# Patient Record
Sex: Female | Born: 1965 | Race: White | Hispanic: No | Marital: Married | Smoking: Former smoker
Health system: Southern US, Community
[De-identification: ages and names within clinical notes are randomized; demographics above are authoritative.]

## PROBLEM LIST (undated history)

## (undated) DIAGNOSIS — J189 Pneumonia, unspecified organism: Secondary | ICD-10-CM

## (undated) DIAGNOSIS — R531 Weakness: Secondary | ICD-10-CM

## (undated) DIAGNOSIS — K5900 Constipation, unspecified: Secondary | ICD-10-CM

## (undated) DIAGNOSIS — E119 Type 2 diabetes mellitus without complications: Secondary | ICD-10-CM

## (undated) DIAGNOSIS — R350 Frequency of micturition: Secondary | ICD-10-CM

## (undated) DIAGNOSIS — R51 Headache: Secondary | ICD-10-CM

## (undated) DIAGNOSIS — R3912 Poor urinary stream: Secondary | ICD-10-CM

## (undated) DIAGNOSIS — B009 Herpesviral infection, unspecified: Secondary | ICD-10-CM

## (undated) DIAGNOSIS — F329 Major depressive disorder, single episode, unspecified: Secondary | ICD-10-CM

## (undated) DIAGNOSIS — M62838 Other muscle spasm: Secondary | ICD-10-CM

## (undated) DIAGNOSIS — F32A Depression, unspecified: Secondary | ICD-10-CM

## (undated) DIAGNOSIS — G4700 Insomnia, unspecified: Secondary | ICD-10-CM

## (undated) HISTORY — PX: OTHER SURGICAL HISTORY: SHX169

## (undated) HISTORY — PX: MOUTH SURGERY: SHX715

---

## 2013-08-15 ENCOUNTER — Other Ambulatory Visit: Payer: Self-pay | Admitting: Orthopedic Surgery

## 2013-08-16 ENCOUNTER — Encounter (HOSPITAL_COMMUNITY): Payer: Self-pay | Admitting: Pharmacist

## 2013-08-22 ENCOUNTER — Encounter (HOSPITAL_COMMUNITY)
Admission: RE | Admit: 2013-08-22 | Discharge: 2013-08-22 | Disposition: A | Discharge status: Home / Self Care | Payer: BC Managed Care – PPO | Source: Ambulatory Visit | Attending: Orthopedic Surgery | Admitting: Orthopedic Surgery

## 2013-08-22 ENCOUNTER — Encounter (HOSPITAL_COMMUNITY): Payer: Self-pay

## 2013-08-22 HISTORY — DX: Depression, unspecified: F32.A

## 2013-08-22 HISTORY — DX: Insomnia, unspecified: G47.00

## 2013-08-22 HISTORY — DX: Constipation, unspecified: K59.00

## 2013-08-22 HISTORY — DX: Herpesviral infection, unspecified: B00.9

## 2013-08-22 HISTORY — DX: Frequency of micturition: R35.0

## 2013-08-22 HISTORY — DX: Headache: R51

## 2013-08-22 HISTORY — DX: Poor urinary stream: R39.12

## 2013-08-22 HISTORY — DX: Other muscle spasm: M62.838

## 2013-08-22 HISTORY — DX: Type 2 diabetes mellitus without complications: E11.9

## 2013-08-22 HISTORY — DX: Major depressive disorder, single episode, unspecified: F32.9

## 2013-08-22 HISTORY — DX: Weakness: R53.1

## 2013-08-22 HISTORY — DX: Pneumonia, unspecified organism: J18.9

## 2013-08-22 LAB — CBC WITH DIFFERENTIAL/PLATELET
BASOS ABS: 0.1 10*3/uL (ref 0.0–0.1)
Basophils Relative: 1 % (ref 0–1)
EOS ABS: 0.4 10*3/uL (ref 0.0–0.7)
Eosinophils Relative: 3 % (ref 0–5)
HEMATOCRIT: 39.6 % (ref 36.0–46.0)
Hemoglobin: 14.1 g/dL (ref 12.0–15.0)
LYMPHS ABS: 3.7 10*3/uL (ref 0.7–4.0)
LYMPHS PCT: 30 % (ref 12–46)
MCH: 29.4 pg (ref 26.0–34.0)
MCHC: 35.6 g/dL (ref 30.0–36.0)
MCV: 82.7 fL (ref 78.0–100.0)
Monocytes Absolute: 0.9 10*3/uL (ref 0.1–1.0)
Monocytes Relative: 7 % (ref 3–12)
Neutro Abs: 7.2 10*3/uL (ref 1.7–7.7)
Neutrophils Relative %: 59 % (ref 43–77)
PLATELETS: 289 10*3/uL (ref 150–400)
RBC: 4.79 MIL/uL (ref 3.87–5.11)
RDW: 12.7 % (ref 11.5–15.5)
WBC: 12.3 10*3/uL — ABNORMAL HIGH (ref 4.0–10.5)

## 2013-08-22 LAB — URINALYSIS, ROUTINE W REFLEX MICROSCOPIC
BILIRUBIN URINE: NEGATIVE
Glucose, UA: 500 mg/dL — AB
Hgb urine dipstick: NEGATIVE
KETONES UR: NEGATIVE mg/dL
Leukocytes, UA: NEGATIVE
Nitrite: NEGATIVE
Protein, ur: NEGATIVE mg/dL
Specific Gravity, Urine: 1.017 (ref 1.005–1.030)
UROBILINOGEN UA: 0.2 mg/dL (ref 0.0–1.0)
pH: 6 (ref 5.0–8.0)

## 2013-08-22 LAB — COMPREHENSIVE METABOLIC PANEL
ALBUMIN: 3.6 g/dL (ref 3.5–5.2)
ALT: 25 U/L (ref 0–35)
AST: <5 U/L (ref 0–37)
Alkaline Phosphatase: 55 U/L (ref 39–117)
BUN: 8 mg/dL (ref 6–23)
CO2: 29 mEq/L (ref 19–32)
CREATININE: 0.71 mg/dL (ref 0.50–1.10)
Calcium: 10 mg/dL (ref 8.4–10.5)
Chloride: 94 mEq/L — ABNORMAL LOW (ref 96–112)
GFR calc Af Amer: >90 mL/min (ref >90)
GFR calc non Af Amer: >90 mL/min (ref >90)
Glucose, Bld: 175 mg/dL — ABNORMAL HIGH (ref 70–99)
Potassium: 3.8 mEq/L (ref 3.7–5.3)
Sodium: 135 mEq/L — ABNORMAL LOW (ref 137–147)
TOTAL PROTEIN: 7.7 g/dL (ref 6.0–8.3)
Total Bilirubin: <0.2 mg/dL — ABNORMAL LOW (ref 0.3–1.2)

## 2013-08-22 LAB — SURGICAL PCR SCREEN
MRSA, PCR: NEGATIVE
Staphylococcus aureus: NEGATIVE

## 2013-08-22 LAB — TYPE AND SCREEN
ABO/RH(D): O POS
ANTIBODY SCREEN: NEGATIVE

## 2013-08-22 LAB — ABO/RH: ABO/RH(D): O POS

## 2013-08-22 LAB — PROTIME-INR
INR: 0.91 (ref 0.00–1.49)
PROTHROMBIN TIME: 12.1 s (ref 11.6–15.2)

## 2013-08-22 LAB — APTT: APTT: 32 s (ref 24–37)

## 2013-08-22 MED ORDER — POVIDONE-IODINE 7.5 % EX SOLN: Freq: Once | CUTANEOUS | Status: DC

## 2013-08-22 NOTE — Progress Notes (Addendum)
Pt doesn't have a cardiologist   Denies ever having a heart cath/echo/stress test  Medical Md is Dr.Vyvyan Wynelle LinkSun with [facility]  Denies EKG or CXR in past

## 2013-08-22 NOTE — Pre-Procedure Instructions (Signed)
Caitlyn Lane  08/22/2013   Your procedure is scheduled on:  Wed, Mar 4 @ 8:30 AM  [facility]  at 6:30 AM.  Call this number if you have problems the morning of surgery: [phone]   Remember:   Do not eat food or drink liquids after midnight.   Take these medicines the morning of surgery with A SIP OF WATER: Prozac(Fluoxetine) and Pain Pill(if needed)              No Goody's,BC's,Aleve,Aspirin,Ibuprofen,Fish Oil,or any Herbal Medications   Do not wear jewelry, make-up or nail polish.  Do not wear lotions, powders, or perfumes. You may wear deodorant.  Do not shave 48 hours prior to surgery.   Do not bring valuables to the hospital.  [facility] is not responsible                  for any belongings or valuables.               Contacts, dentures or bridgework may not be worn into surgery.  Leave suitcase in the car. After surgery it may be brought to your room.  For patients admitted to the hospital, discharge time is determined by your                treatment team.                 Special Instructions:   - Preparing for Surgery  Before surgery, you can play an important role.  Because skin is not sterile, your skin needs to be as free of germs as possible.  You can reduce the number of germs on you skin by washing with CHG (chlorahexidine gluconate) soap before surgery.  CHG is an antiseptic cleaner which kills germs and bonds with the skin to continue killing germs even after washing.  Please DO NOT use if you have an allergy to CHG or antibacterial soaps.  If your skin becomes reddened/irritated stop using the CHG and inform your nurse when you arrive at Short Stay.  Do not shave (including legs and underarms) for at least 48 hours prior to the first CHG shower.  You may shave your face.  Please follow these instructions carefully:   1.  Shower with CHG Soap the night before surgery and the                                morning of  Surgery.  2.  If you choose to wash your hair, wash your hair first as usual with your       normal shampoo.  3.  After you shampoo, rinse your hair and body thoroughly to remove the                      Shampoo.  4.  Use CHG as you would any other liquid soap.  You can apply chg directly       to the skin and wash gently with scrungie or a clean washcloth.  5.  Apply the CHG Soap to your body ONLY FROM THE NECK DOWN.        Do not use on open wounds or open sores.  Avoid contact with your eyes,       ears, mouth and genitals (private parts).  Wash genitals (private parts)       with  your normal soap.  6.  Wash thoroughly, paying special attention to the area where your surgery        will be performed.  7.  Thoroughly rinse your body with warm water from the neck down.  8.  DO NOT shower/wash with your normal soap after using and rinsing off       the CHG Soap.  9.  Pat yourself dry with a clean towel.            10.  Wear clean pajamas.            11.  Place clean sheets on your bed the night of your first shower and do not        sleep with pets.  Day of Surgery  Do not apply any lotions/deoderants the morning of surgery.  Please wear clean clothes to the hospital/surgery center.     Please read over the following fact sheets that you were given: Pain Booklet, Coughing and Deep Breathing, Blood Transfusion Information, MRSA Information and Surgical Site Infection Prevention

## 2013-08-23 MED ORDER — CEFAZOLIN SODIUM-DEXTROSE 2-3 GM-% IV SOLR
2.0000 g | INTRAVENOUS | Status: AC
  Administered 2013-08-24: 2 g via INTRAVENOUS
  Filled 2013-08-23: qty 50

## 2013-08-24 ENCOUNTER — Encounter (HOSPITAL_COMMUNITY): Payer: BC Managed Care – PPO | Admitting: Anesthesiology

## 2013-08-24 ENCOUNTER — Ambulatory Visit (HOSPITAL_COMMUNITY): Payer: BC Managed Care – PPO | Admitting: Anesthesiology

## 2013-08-24 ENCOUNTER — Inpatient Hospital Stay (HOSPITAL_COMMUNITY)
Admission: RE | Admit: 2013-08-24 | Discharge: 2013-08-25 | DRG: 472 | Disposition: A | Discharge status: Home / Self Care | Payer: BC Managed Care – PPO | Source: Ambulatory Visit | Attending: Orthopedic Surgery | Admitting: Orthopedic Surgery

## 2013-08-24 ENCOUNTER — Observation Stay (HOSPITAL_COMMUNITY): Payer: BC Managed Care – PPO

## 2013-08-24 ENCOUNTER — Encounter (HOSPITAL_COMMUNITY): Payer: Self-pay | Admitting: *Deleted

## 2013-08-24 ENCOUNTER — Encounter (HOSPITAL_COMMUNITY)
Admission: RE | Disposition: A | Discharge status: Home / Self Care | Payer: Self-pay | Source: Ambulatory Visit | Attending: Orthopedic Surgery

## 2013-08-24 DIAGNOSIS — M5000 Cervical disc disorder with myelopathy, unspecified cervical region: Principal | ICD-10-CM | POA: Diagnosis present

## 2013-08-24 DIAGNOSIS — F329 Major depressive disorder, single episode, unspecified: Secondary | ICD-10-CM | POA: Diagnosis present

## 2013-08-24 DIAGNOSIS — F3289 Other specified depressive episodes: Secondary | ICD-10-CM | POA: Diagnosis present

## 2013-08-24 DIAGNOSIS — G4700 Insomnia, unspecified: Secondary | ICD-10-CM | POA: Diagnosis present

## 2013-08-24 DIAGNOSIS — M62838 Other muscle spasm: Secondary | ICD-10-CM | POA: Diagnosis present

## 2013-08-24 DIAGNOSIS — Z87891 Personal history of nicotine dependence: Secondary | ICD-10-CM

## 2013-08-24 DIAGNOSIS — E119 Type 2 diabetes mellitus without complications: Secondary | ICD-10-CM | POA: Diagnosis present

## 2013-08-24 DIAGNOSIS — Z889 Allergy status to unspecified drugs, medicaments and biological substances status: Secondary | ICD-10-CM

## 2013-08-24 DIAGNOSIS — Z79899 Other long term (current) drug therapy: Secondary | ICD-10-CM

## 2013-08-24 DIAGNOSIS — G959 Disease of spinal cord, unspecified: Secondary | ICD-10-CM | POA: Diagnosis present

## 2013-08-24 DIAGNOSIS — B009 Herpesviral infection, unspecified: Secondary | ICD-10-CM | POA: Diagnosis present

## 2013-08-24 HISTORY — PX: ANTERIOR CERVICAL DECOMP/DISCECTOMY FUSION: SHX1161

## 2013-08-24 LAB — GLUCOSE, CAPILLARY
GLUCOSE-CAPILLARY: 142 mg/dL — AB (ref 70–99)
GLUCOSE-CAPILLARY: 230 mg/dL — AB (ref 70–99)
Glucose-Capillary: 174 mg/dL — ABNORMAL HIGH (ref 70–99)
Glucose-Capillary: 165 mg/dL — ABNORMAL HIGH (ref 70–99)
Glucose-Capillary: 158 mg/dL — ABNORMAL HIGH (ref 70–99)

## 2013-08-24 LAB — HCG, SERUM, QUALITATIVE: Preg, Serum: NEGATIVE

## 2013-08-24 SURGERY — ANTERIOR CERVICAL DECOMPRESSION/DISCECTOMY FUSION 1 LEVEL
Anesthesia: General | Site: Spine Cervical

## 2013-08-24 MED ORDER — ARTIFICIAL TEARS OP OINT
TOPICAL_OINTMENT | OPHTHALMIC | Status: AC
  Filled 2013-08-24: qty 3.5

## 2013-08-24 MED ORDER — PROPOFOL 10 MG/ML IV BOLUS
INTRAVENOUS | Status: DC | PRN
  Administered 2013-08-24: 200 mg via INTRAVENOUS

## 2013-08-24 MED ORDER — BISACODYL 5 MG PO TBEC
5.0000 mg | DELAYED_RELEASE_TABLET | Freq: Every day | ORAL | Status: DC | PRN
  Filled 2013-08-24: qty 1

## 2013-08-24 MED ORDER — DIAZEPAM 5 MG PO TABS
ORAL_TABLET | ORAL | Status: AC
  Filled 2013-08-24: qty 1

## 2013-08-24 MED ORDER — MORPHINE SULFATE 2 MG/ML IJ SOLN
1.0000 mg | INTRAMUSCULAR | Status: DC | PRN
Start: 2013-08-24 — End: 2013-08-25

## 2013-08-24 MED ORDER — DOCUSATE SODIUM 100 MG PO CAPS
100.0000 mg | ORAL_CAPSULE | Freq: Two times a day (BID) | ORAL | Status: DC
  Administered 2013-08-24 – 2013-08-25 (×3): 100 mg via ORAL
  Filled 2013-08-24 (×4): qty 1

## 2013-08-24 MED ORDER — ONDANSETRON HCL 4 MG/2ML IJ SOLN
INTRAMUSCULAR | Status: DC | PRN
  Administered 2013-08-24: 4 mg via INTRAVENOUS

## 2013-08-24 MED ORDER — LIDOCAINE HCL (CARDIAC) 20 MG/ML IV SOLN
INTRAVENOUS | Status: DC | PRN
  Administered 2013-08-24: 100 mg via INTRAVENOUS

## 2013-08-24 MED ORDER — OXYCODONE-ACETAMINOPHEN 5-325 MG PO TABS
1.0000 | ORAL_TABLET | ORAL | Status: DC | PRN
  Administered 2013-08-24 – 2013-08-25 (×5): 2 via ORAL
  Filled 2013-08-24 (×5): qty 2

## 2013-08-24 MED ORDER — LIDOCAINE HCL (CARDIAC) 20 MG/ML IV SOLN
INTRAVENOUS | Status: AC
  Filled 2013-08-24: qty 5

## 2013-08-24 MED ORDER — BUPIVACAINE-EPINEPHRINE (PF) 0.25% -1:200000 IJ SOLN
INTRAMUSCULAR | Status: AC
  Filled 2013-08-24: qty 30

## 2013-08-24 MED ORDER — THROMBIN 20000 UNITS EX SOLR
CUTANEOUS | Status: DC | PRN
  Administered 2013-08-24: 10:00:00

## 2013-08-24 MED ORDER — FLEET ENEMA 7-19 GM/118ML RE ENEM
1.0000 | ENEMA | Freq: Once | RECTAL | Status: AC | PRN
  Filled 2013-08-24: qty 1

## 2013-08-24 MED ORDER — PROMETHAZINE HCL 25 MG/ML IJ SOLN: 6.2500 mg | INTRAMUSCULAR | Status: DC | PRN

## 2013-08-24 MED ORDER — PROPOFOL 10 MG/ML IV BOLUS
INTRAVENOUS | Status: AC
  Filled 2013-08-24: qty 20

## 2013-08-24 MED ORDER — NEOSTIGMINE METHYLSULFATE 1 MG/ML IJ SOLN
INTRAMUSCULAR | Status: DC | PRN
  Administered 2013-08-24: 4 mg via INTRAVENOUS

## 2013-08-24 MED ORDER — LACTATED RINGERS IV SOLN
INTRAVENOUS | Status: DC | PRN
  Administered 2013-08-24 (×2): via INTRAVENOUS

## 2013-08-24 MED ORDER — ROCURONIUM BROMIDE 100 MG/10ML IV SOLN
INTRAVENOUS | Status: DC | PRN
  Administered 2013-08-24: 10 mg via INTRAVENOUS
  Administered 2013-08-24: 50 mg via INTRAVENOUS
  Administered 2013-08-24: 10 mg via INTRAVENOUS

## 2013-08-24 MED ORDER — SODIUM CHLORIDE 0.9 % IJ SOLN: 3.0000 mL | INTRAMUSCULAR | Status: DC | PRN

## 2013-08-24 MED ORDER — ALUM & MAG HYDROXIDE-SIMETH 200-200-20 MG/5ML PO SUSP: 30.0000 mL | Freq: Four times a day (QID) | ORAL | Status: DC | PRN

## 2013-08-24 MED ORDER — GLYCOPYRROLATE 0.2 MG/ML IJ SOLN
INTRAMUSCULAR | Status: DC | PRN
  Administered 2013-08-24: 0.6 mg via INTRAVENOUS

## 2013-08-24 MED ORDER — ONDANSETRON HCL 4 MG/2ML IJ SOLN: 4.0000 mg | INTRAMUSCULAR | Status: DC | PRN

## 2013-08-24 MED ORDER — DIAZEPAM 5 MG PO TABS
5.0000 mg | ORAL_TABLET | Freq: Four times a day (QID) | ORAL | Status: DC | PRN
  Administered 2013-08-24 – 2013-08-25 (×4): 5 mg via ORAL
  Filled 2013-08-24 (×3): qty 1

## 2013-08-24 MED ORDER — SODIUM CHLORIDE 0.9 % IJ SOLN
3.0000 mL | Freq: Two times a day (BID) | INTRAMUSCULAR | Status: DC
  Administered 2013-08-24: 3 mL via INTRAVENOUS

## 2013-08-24 MED ORDER — ROCURONIUM BROMIDE 50 MG/5ML IV SOLN
INTRAVENOUS | Status: AC
  Filled 2013-08-24: qty 1

## 2013-08-24 MED ORDER — EPHEDRINE SULFATE 50 MG/ML IJ SOLN
INTRAMUSCULAR | Status: DC | PRN
  Administered 2013-08-24: 5 mg via INTRAVENOUS

## 2013-08-24 MED ORDER — GLYCOPYRROLATE 0.2 MG/ML IJ SOLN
INTRAMUSCULAR | Status: AC
Start: 2013-08-24 — End: 2013-08-24
  Filled 2013-08-24: qty 3

## 2013-08-24 MED ORDER — FLUOXETINE HCL 20 MG PO CAPS: 20.0000 mg | ORAL_CAPSULE | ORAL | Status: DC

## 2013-08-24 MED ORDER — PHENOL 1.4 % MT LIQD: 1.0000 | OROMUCOSAL | Status: DC | PRN

## 2013-08-24 MED ORDER — SENNOSIDES-DOCUSATE SODIUM 8.6-50 MG PO TABS
1.0000 | ORAL_TABLET | Freq: Every evening | ORAL | Status: DC | PRN
  Filled 2013-08-24: qty 1

## 2013-08-24 MED ORDER — ARTIFICIAL TEARS OP OINT
TOPICAL_OINTMENT | OPHTHALMIC | Status: DC | PRN
  Administered 2013-08-24: 1 via OPHTHALMIC

## 2013-08-24 MED ORDER — NEOSTIGMINE METHYLSULFATE 1 MG/ML IJ SOLN
INTRAMUSCULAR | Status: AC
  Filled 2013-08-24: qty 10

## 2013-08-24 MED ORDER — OXYCODONE HCL 5 MG/5ML PO SOLN
5.0000 mg | Freq: Once | ORAL | Status: AC | PRN
  Administered 2013-08-24: 5 mg via ORAL

## 2013-08-24 MED ORDER — OXYCODONE HCL 5 MG PO TABS: 5.0000 mg | ORAL_TABLET | Freq: Once | ORAL | Status: AC | PRN

## 2013-08-24 MED ORDER — MIDAZOLAM HCL 5 MG/5ML IJ SOLN
INTRAMUSCULAR | Status: DC | PRN
  Administered 2013-08-24: 2 mg via INTRAVENOUS

## 2013-08-24 MED ORDER — VARENICLINE TARTRATE 1 MG PO TABS
1.0000 mg | ORAL_TABLET | Freq: Two times a day (BID) | ORAL | Status: DC
  Filled 2013-08-24 (×4): qty 1

## 2013-08-24 MED ORDER — CEFAZOLIN SODIUM 1-5 GM-% IV SOLN
1.0000 g | Freq: Three times a day (TID) | INTRAVENOUS | Status: AC
  Administered 2013-08-24 (×2): 1 g via INTRAVENOUS
  Filled 2013-08-24 (×2): qty 50

## 2013-08-24 MED ORDER — HYDROMORPHONE HCL PF 1 MG/ML IJ SOLN
INTRAMUSCULAR | Status: AC
  Filled 2013-08-24: qty 1

## 2013-08-24 MED ORDER — ACETAMINOPHEN 650 MG RE SUPP: 650.0000 mg | RECTAL | Status: DC | PRN

## 2013-08-24 MED ORDER — FENTANYL CITRATE 0.05 MG/ML IJ SOLN
INTRAMUSCULAR | Status: DC | PRN
  Administered 2013-08-24 (×5): 50 ug via INTRAVENOUS

## 2013-08-24 MED ORDER — MIDAZOLAM HCL 2 MG/2ML IJ SOLN
INTRAMUSCULAR | Status: AC
  Filled 2013-08-24: qty 2

## 2013-08-24 MED ORDER — HYDROMORPHONE HCL PF 1 MG/ML IJ SOLN
0.2500 mg | INTRAMUSCULAR | Status: DC | PRN
  Administered 2013-08-24 (×4): 0.5 mg via INTRAVENOUS

## 2013-08-24 MED ORDER — THROMBIN 20000 UNITS EX SOLR
CUTANEOUS | Status: AC
  Filled 2013-08-24: qty 20000

## 2013-08-24 MED ORDER — OXYCODONE HCL 5 MG/5ML PO SOLN
ORAL | Status: AC
  Filled 2013-08-24: qty 5

## 2013-08-24 MED ORDER — HYDROMORPHONE HCL PF 1 MG/ML IJ SOLN
INTRAMUSCULAR | Status: AC
Start: 2013-08-24 — End: 2013-08-24
  Filled 2013-08-24: qty 1

## 2013-08-24 MED ORDER — STERILE WATER FOR INJECTION IJ SOLN
INTRAMUSCULAR | Status: AC
  Filled 2013-08-24: qty 10

## 2013-08-24 MED ORDER — EPHEDRINE SULFATE 50 MG/ML IJ SOLN
INTRAMUSCULAR | Status: AC
  Filled 2013-08-24: qty 1

## 2013-08-24 MED ORDER — FENTANYL CITRATE 0.05 MG/ML IJ SOLN
INTRAMUSCULAR | Status: AC
  Filled 2013-08-24: qty 5

## 2013-08-24 MED ORDER — MENTHOL 3 MG MT LOZG: 1.0000 | LOZENGE | OROMUCOSAL | Status: DC | PRN

## 2013-08-24 MED ORDER — HYDROMORPHONE HCL PF 1 MG/ML IJ SOLN
INTRAMUSCULAR | Status: DC | PRN
  Administered 2013-08-24: .2 mg via INTRAVENOUS
  Administered 2013-08-24 (×2): .4 mg via INTRAVENOUS

## 2013-08-24 MED ORDER — 0.9 % SODIUM CHLORIDE (POUR BTL) OPTIME
TOPICAL | Status: DC | PRN
  Administered 2013-08-24: 1000 mL

## 2013-08-24 MED ORDER — BUPIVACAINE-EPINEPHRINE 0.25% -1:200000 IJ SOLN
INTRAMUSCULAR | Status: DC | PRN
  Administered 2013-08-24: 2 mL

## 2013-08-24 MED ORDER — ACETAMINOPHEN 325 MG PO TABS: 650.0000 mg | ORAL_TABLET | ORAL | Status: DC | PRN

## 2013-08-24 MED ORDER — SODIUM CHLORIDE 0.9 % IV SOLN: 250.0000 mL | INTRAVENOUS | Status: DC

## 2013-08-24 MED ORDER — ONDANSETRON HCL 4 MG/2ML IJ SOLN
INTRAMUSCULAR | Status: AC
  Filled 2013-08-24: qty 2

## 2013-08-24 SURGICAL SUPPLY — 73 items
BENZOIN TINCTURE PRP APPL 2/3 (GAUZE/BANDAGES/DRESSINGS) ×3 IMPLANT
BIT DRILL NEURO 2X3.1 SFT TUCH (MISCELLANEOUS) ×1 IMPLANT
BIT DRILL SKYLINE 14 (BIT) ×1
BIT DRILL SKYLINE 14MM (BIT) ×1 IMPLANT
BLADE SURG 15 STRL LF DISP TIS (BLADE) ×1 IMPLANT
BLADE SURG 15 STRL SS (BLADE) ×2
BLADE SURG ROTATE 9660 (MISCELLANEOUS) ×3 IMPLANT
BUR MATCHSTICK NEURO 3.0 LAGG (BURR) IMPLANT
CARTRIDGE OIL MAESTRO DRILL (MISCELLANEOUS) ×1 IMPLANT
CLOSURE STERI-STRIP 1/2X4 (GAUZE/BANDAGES/DRESSINGS) ×1
CLOSURE WOUND 1/2 X4 (GAUZE/BANDAGES/DRESSINGS) ×1
CLSR STERI-STRIP ANTIMIC 1/2X4 (GAUZE/BANDAGES/DRESSINGS) ×2 IMPLANT
CORDS BIPOLAR (ELECTRODE) ×3 IMPLANT
COVER SURGICAL LIGHT HANDLE (MISCELLANEOUS) ×3 IMPLANT
CRADLE DONUT ADULT HEAD (MISCELLANEOUS) ×3 IMPLANT
DIFFUSER DRILL AIR PNEUMATIC (MISCELLANEOUS) ×3 IMPLANT
DRAIN JACKSON RD 7FR 3/32 (WOUND CARE) IMPLANT
DRAPE C-ARM 42X72 X-RAY (DRAPES) ×3 IMPLANT
DRAPE POUCH INSTRU U-SHP 10X18 (DRAPES) ×3 IMPLANT
DRAPE SURG 17X23 STRL (DRAPES) ×9 IMPLANT
DRILL BIT SKYLINE 14MM (BIT) ×2
DRILL NEURO 2X3.1 SOFT TOUCH (MISCELLANEOUS) ×3
DURAPREP 26ML APPLICATOR (WOUND CARE) ×3 IMPLANT
ELECT COATED BLADE 2.86 ST (ELECTRODE) ×3 IMPLANT
ELECT REM PT RETURN 9FT ADLT (ELECTROSURGICAL) ×3
ELECTRODE REM PT RTRN 9FT ADLT (ELECTROSURGICAL) ×1 IMPLANT
EVACUATOR SILICONE 100CC (DRAIN) IMPLANT
GAUZE SPONGE 4X4 16PLY XRAY LF (GAUZE/BANDAGES/DRESSINGS) ×3 IMPLANT
GLOVE BIO SURGEON STRL SZ7 (GLOVE) ×3 IMPLANT
GLOVE BIO SURGEON STRL SZ8 (GLOVE) ×3 IMPLANT
GLOVE BIOGEL PI IND STRL 7.0 (GLOVE) ×2 IMPLANT
GLOVE BIOGEL PI IND STRL 8 (GLOVE) ×1 IMPLANT
GLOVE BIOGEL PI INDICATOR 7.0 (GLOVE) ×4
GLOVE BIOGEL PI INDICATOR 8 (GLOVE) ×2
GOWN SRG XL XLNG 56XLVL 4 (GOWN DISPOSABLE) ×1 IMPLANT
GOWN STRL NON-REIN LRG LVL3 (GOWN DISPOSABLE) ×3 IMPLANT
GOWN STRL NON-REIN XL XLG LVL4 (GOWN DISPOSABLE) ×2
INTERLOCK LRDTC CRVCL VBR 6MM (Bone Implant) ×1 IMPLANT
IV CATH 14GX2 1/4 (CATHETERS) ×3 IMPLANT
KIT BASIN OR (CUSTOM PROCEDURE TRAY) ×3 IMPLANT
KIT ROOM TURNOVER OR (KITS) ×3 IMPLANT
LORDOTIC CERVICAL VBR 6MM SM (Bone Implant) ×3 IMPLANT
MANIFOLD NEPTUNE II (INSTRUMENTS) ×3 IMPLANT
NEEDLE 27GAX1X1/2 (NEEDLE) ×3 IMPLANT
NEEDLE SPNL 20GX3.5 QUINCKE YW (NEEDLE) ×3 IMPLANT
NS IRRIG 1000ML POUR BTL (IV SOLUTION) ×3 IMPLANT
OIL CARTRIDGE MAESTRO DRILL (MISCELLANEOUS) ×3
PACK ORTHO CERVICAL (CUSTOM PROCEDURE TRAY) ×3 IMPLANT
PAD ARMBOARD 7.5X6 YLW CONV (MISCELLANEOUS) ×6 IMPLANT
PATTIES SURGICAL .5 X.5 (GAUZE/BANDAGES/DRESSINGS) ×3 IMPLANT
PATTIES SURGICAL .5 X1 (DISPOSABLE) ×3 IMPLANT
PIN DISTRACTION 14 (PIN) ×6 IMPLANT
PLATE SKYLINE 12MM (Plate) ×3 IMPLANT
PUTTY BONE DBX 2.5 MIS (Bone Implant) ×3 IMPLANT
SCREW VAR SELF TAP SKYLINE 14M (Screw) ×12 IMPLANT
SPONGE GAUZE 4X4 12PLY (GAUZE/BANDAGES/DRESSINGS) ×3 IMPLANT
SPONGE INTESTINAL PEANUT (DISPOSABLE) ×3 IMPLANT
SPONGE SURGIFOAM ABS GEL 100 (HEMOSTASIS) ×3 IMPLANT
STRIP CLOSURE SKIN 1/2X4 (GAUZE/BANDAGES/DRESSINGS) ×2 IMPLANT
SURGIFLO TRUKIT (HEMOSTASIS) IMPLANT
SUT MNCRL AB 4-0 PS2 18 (SUTURE) ×3 IMPLANT
SUT SILK 4 0 (SUTURE)
SUT SILK 4-0 18XBRD TIE 12 (SUTURE) IMPLANT
SUT VIC AB 2-0 CT2 18 VCP726D (SUTURE) ×3 IMPLANT
SYR BULB IRRIGATION 50ML (SYRINGE) ×3 IMPLANT
SYR CONTROL 10ML LL (SYRINGE) ×6 IMPLANT
TAPE CLOTH 4X10 WHT NS (GAUZE/BANDAGES/DRESSINGS) ×3 IMPLANT
TAPE CLOTH SURG 4X10 WHT LF (GAUZE/BANDAGES/DRESSINGS) ×3 IMPLANT
TAPE UMBILICAL COTTON 1/8X30 (MISCELLANEOUS) ×3 IMPLANT
TOWEL OR 17X24 6PK STRL BLUE (TOWEL DISPOSABLE) ×3 IMPLANT
TOWEL OR 17X26 10 PK STRL BLUE (TOWEL DISPOSABLE) ×3 IMPLANT
WATER STERILE IRR 1000ML POUR (IV SOLUTION) IMPLANT
YANKAUER SUCT BULB TIP NO VENT (SUCTIONS) ×3 IMPLANT

## 2013-08-24 NOTE — Anesthesia Preprocedure Evaluation (Addendum)
Anesthesia Evaluation  Patient identified by MRN, date of birth, ID band Patient awake    Reviewed: Allergy & Precautions, H&P , NPO status , Patient's Chart, lab work & pertinent test results  Airway Mallampati: I TM Distance: >3 FB Neck ROM: Limited    Dental  (+) Teeth Intact   Pulmonary neg pulmonary ROS, former smoker,  breath sounds clear to auscultation        Cardiovascular negative cardio ROS  Rhythm:Regular Rate:Normal     Neuro/Psych  Headaches, PSYCHIATRIC DISORDERS Depression    GI/Hepatic negative GI ROS, Neg liver ROS,   Endo/Other  diabetes, Insulin Dependent  Renal/GU negative Renal ROS     Musculoskeletal   Abdominal   Peds  Hematology   Anesthesia Other Findings   Reproductive/Obstetrics                        Anesthesia Physical Anesthesia Plan  ASA: II  Anesthesia Plan: General   Post-op Pain Management:    Induction: Intravenous  Airway Management Planned: Oral ETT  Additional Equipment:   Intra-op Plan:   Post-operative Plan: Extubation in OR  Informed Consent: I have reviewed the patients History and Physical, chart, labs and discussed the procedure including the risks, benefits and alternatives for the proposed anesthesia with the patient or authorized representative who has indicated his/her understanding and acceptance.   Dental advisory given  Plan Discussed with: CRNA, Anesthesiologist and Surgeon  Anesthesia Plan Comments:        Anesthesia Quick Evaluation

## 2013-08-24 NOTE — Transfer of Care (Signed)
Immediate Anesthesia Transfer of Care Note  Patient: Leisure centre managerAmber Lane  Procedure(s) Performed: Procedure(s) with comments: ANTERIOR CERVICAL DECOMPRESSION/DISCECTOMY FUSION 1 LEVEL (N/A) - Anterior cervical decompression fusion, cervical 5-6 with instrumentation and allograft  Patient Location: PACU  Anesthesia Type:General  Level of Consciousness: awake, alert  and oriented  Airway & Oxygen Therapy: Patient Spontanous Breathing and Patient connected to nasal cannula oxygen  Post-op Assessment: Report given to PACU RN  Post vital signs: Reviewed and stable  Complications: No apparent anesthesia complications

## 2013-08-24 NOTE — Progress Notes (Signed)
Orthopedic Tech Progress Note Patient Details:  Caitlyn Lane 04/26/1966 [id number]  Ortho Devices Type of Ortho Device: [place] cervical collar Ortho Device/Splint Interventions: Application   [name], [name] 08/24/2013, 12:52 PM

## 2013-08-24 NOTE — Progress Notes (Signed)
Orthopedic Tech Progress Note Patient Details:  Caitlyn Lane 05/30/1966 [id number]  Ortho Devices Type of Ortho Device: Soft collar Ortho Device/Splint Interventions: Application   [name], [name] 08/24/2013, 12:51 PM

## 2013-08-24 NOTE — Preoperative (Signed)
Beta Blockers   Reason not to administer Beta Blockers:Not Applicable 

## 2013-08-24 NOTE — Anesthesia Postprocedure Evaluation (Signed)
Anesthesia Post Note  Patient: Caitlyn Lane  Procedure(s) Performed: Procedure(s) (LRB): ANTERIOR CERVICAL DECOMPRESSION/DISCECTOMY FUSION 1 LEVEL (N/A)  Anesthesia type: general  Patient location: PACU  Post pain: Pain level controlled  Post assessment: Patient's Cardiovascular Status Stable  Last Vitals:  Filed Vitals:   08/24/13 1210  BP: 159/82  Pulse: 93  Temp: 37.4 C  Resp: 18    Post vital signs: Reviewed and stable  Level of consciousness: sedated  Complications: No apparent anesthesia complications

## 2013-08-24 NOTE — Anesthesia Procedure Notes (Signed)
Procedure Name: Intubation Date/Time: 08/24/2013 8:35 AM Performed by: Delia ChimesVOSH, Shenique Childers ELLEN-ELIZABETH Pre-anesthesia Checklist: Patient identified, Timeout performed, Emergency Drugs available, Suction available and Patient being monitored Patient Re-evaluated:Patient Re-evaluated prior to inductionOxygen Delivery Method: Circle system utilized Preoxygenation: Pre-oxygenation with 100% oxygen Intubation Type: IV induction Ventilation: Mask ventilation without difficulty Grade View: Grade I Tube size: 7.0 mm Number of attempts: 2 Airway Equipment and Method: Video-laryngoscopy Placement Confirmation: ETT inserted through vocal cords under direct vision,  breath sounds checked- equal and bilateral and positive ETCO2 Secured at: 22 cm Tube secured with: Tape Dental Injury: Injury to lip  Comments: DL x 1 with MAC 3 without visualization without hyperextending neck PSR.  DLx 1 with Glidescope; Grade 1 view and neutral position of neck maintained.  Small cut to right corner of lip; lubricant applied.

## 2013-08-24 NOTE — H&P (Signed)
PREOPERATIVE H&P  Chief Complaint: right arm pain   HPI: Caitlyn Lane is a 48 y.o. female who presents with ongoing pain in the right arm. MRI = large c5/6 HNP causing compression of the spinal cord and exiting C6 nerve. Patient wishes to proceed with an ACDF at C5/6.  Past Medical History  Diagnosis Date  . Muscle spasm     takes Flexeril daily as needed  . Insomnia     takes Valerian nighly as needed  . Pneumonia     waling-7-5636yrs ago  . Headache(784.0)   . Weakness     pain and tingling  . Constipation     related to pain meds  . Weak urinary stream     since taking pain med  . Urinary frequency   . Diabetes mellitus without complication     borderline  . Depression     takes Prozac daily as needed  . Herpes     no outbreak in yrs   Past Surgical History  Procedure Laterality Date  . Bone spur removed       from right pointer finger  . Mouth surgery     History   Social History  . Marital Status: Married    Spouse Name: N/A    Number of Children: N/A  . Years of Education: N/A   Social History Main Topics  . Smoking status: Former Games developermoker  . Smokeless tobacco: None     Comment: quit smoking 06-04-13  . Alcohol Use: Yes     Comment: very occasionally  . Drug Use: No  . Sexual Activity: Yes   Other Topics Concern  . None   Social History Narrative  . None   History reviewed. No pertinent family history. Allergies  Allergen Reactions  . Naproxen     Causes pounding sound in ears   Prior to Admission medications   Medication Sig Start Date End Date Taking? Authorizing Provider  cyclobenzaprine (FLEXERIL) 5 MG tablet Take 5 mg by mouth 2 (two) times daily as needed for muscle spasms.   Yes Historical Provider, MD  FLUoxetine (PROZAC) 20 MG capsule Take 20 mg by mouth See admin instructions. Takes 20 mg daily x 2 weeks prior to her menstrual cycle.   Yes Historical Provider, MD  HYDROcodone-acetaminophen (NORCO) 10-325 MG per tablet Take 1-2 tablets by  mouth every 4 (four) hours as needed (pain).   Yes Historical Provider, MD  VALERIAN PO Take 2 capsules by mouth at bedtime. Used for sleep   Yes Historical Provider, MD  varenicline (CHANTIX) 1 MG tablet Take 1 mg by mouth 2 (two) times daily.   Yes Historical Provider, MD     All other systems have been reviewed and were otherwise negative with the exception of those mentioned in the HPI and as above.  Physical Exam: Filed Vitals:   08/24/13 0659  BP: 160/78  Pulse: 92  Temp: 98.2 F (36.8 C)  Resp: 20    General: Alert, no acute distress Cardiovascular: No pedal edema Respiratory: No cyanosis, no use of accessory musculature GI: No organomegaly, abdomen is soft and non-tender Skin: No lesions in the area of chief complaint Neurologic: Sensation intact distally Psychiatric: Patient is competent for consent with normal mood and affect Lymphatic: No axillary or cervical lymphadenopathy  MUSCULOSKELETAL: + spur;ing's on right  Assessment/Plan: Right arm pain Plan for Procedure(s): ANTERIOR CERVICAL DECOMPRESSION/DISCECTOMY FUSION C 5/6   Emilee HeroUMONSKI,Adrin Julian LEONARD, MD 08/24/2013 8:09 AM

## 2013-08-25 ENCOUNTER — Encounter (HOSPITAL_COMMUNITY): Payer: Self-pay | Admitting: Orthopedic Surgery

## 2013-08-25 LAB — GLUCOSE, CAPILLARY
GLUCOSE-CAPILLARY: 153 mg/dL — AB (ref 70–99)
Glucose-Capillary: 157 mg/dL — ABNORMAL HIGH (ref 70–99)

## 2013-08-25 NOTE — Progress Notes (Signed)
Utilization review completed.  

## 2013-08-25 NOTE — Op Note (Signed)
Caitlyn Lane, Caitlyn Lane                 ACCOUNT NO.:  [id number]  MEDICAL RECORD NO.:  [id number]  LOCATION:  3C04C                        FACILITY:  [facility]  PHYSICIAN:  Caitlyn Bamberg, Caitlyn Lane      DATE OF BIRTH:  1965/10/28  DATE OF PROCEDURE:  08/24/2013                              OPERATIVE REPORT   PREOPERATIVE DIAGNOSES: 1. Right-sided C6 radiculopathy. 2. 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 3. 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  POSTOPERATIVE DIAGNOSES: 1. Right-sided C6 radiculopathy. 2. 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 3. 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  PROCEDURES: 1. Anterior cervical decompression and fusion, C5-6. 2. Placement of anterior instrumentation, C5-6. 3. Insertion of interbody device x1 (Titan 6-mm lordotic small     interbody spacer). 4. Use of morselized allograft (DBX mix). 5. Intraoperative use of fluoroscopy.  SURGEON:  Caitlyn Bamberg, Caitlyn Lane  ASSISTANT:  Caitlyn Coop, PA-C  ANESTHESIA:  General endotracheal anesthesia.  COMPLICATIONS:  None.  DISPOSITION:  Stable.  ESTIMATED BLOOD LOSS:  Minimal.  INDICATION FOR PROCEDURE:  Briefly, Caitlyn Lane is a very pleasant 48 year old female who did present to me on August 15, 2013, with severe pain in the neck and severe pain in the right arm.  I did review an MRI which was clearly notable for a very large C5-6 disk herniation, clearly causing compression on the spinal cord, and also causing severe compression of the exiting C6 nerve.  The patient's pain was severe and it had been increasing.  She did also feel weakness on the right side. She did have a trace of weakness on the right side as well.  Given the MRI findings and her pain and weakness, we did discuss proceeding with a C5-6 anterior cervical decompression and fusion with instrumentation and allograft.  The patient did fully understand the risks and limitations of the procedure as outlined in my preoperative  note.  OPERATIVE DETAILS:  On August 24, 2013, the patient was brought to the surgery and general endotracheal anesthesia was administered.  The patient was placed supine on a hospital bed.  The neck was gently extended.  Of note, I did ensure that the Anesthesia Team did use a GlideScope and they did minimize extension of the neck, given the compression noted on her spinal cord.  The patient's arms were secured to her sides and all bony prominences were meticulously padded.  The neck was prepped and draped in usual sterile fashion and a time-out procedure was performed.  I then made a left-sided transverse incision overlying the C5-6 intervertebral space.  The platysma was sharply incised.  The plane between the sternocleidomastoid muscle and the strap muscles was identified and explored.  The anterior cervical spine was readily noted.  I did obtain a lateral intraoperative fluoroscopic view to confirm the appropriate operative level.  I then subperiosteally exposed the vertebral bodies of C5 and C6.  A self-retaining Shadow-Line retractor was placed.  Caspar pins were placed into the C5 and C6 vertebral bodies and gentle distraction was applied.  I then used a 15- blade knife to perform an anterior annulotomy.  I then used a series of curettes and pituitary rongeurs to perform a  thorough and complete diskectomy.  The posterior longitudinal ligament was then identified. The posterior longitudinal ligament was entered using a nerve hook.  Of note, the posterior longitudinal ligament was initially injured on the left side of the spinal canal, given the large herniated fragment associated with the right side.  I then used a micro nerve hook in order to tease away multiple fragments, clearly causing compression of the right hemi cord.  I then used #1 followed by #2 Kerrison punch to remove the disk fragments involving the region of the foramen and the region of the left spinal canal.   Multiple large-sized disk fragments were identified and removed.  I then used a nerve hook to confirm that there was no residual compression involving either the spinal canal or the exiting C6 nerve.  I was very pleased with the decompression.  I then prepared the endplates and placed a series of interbody trials.  I did feel the 6-mm lordotic interbody spacer would be the most appropriate fit.  An appropriate sized spacer was then packed with a DBX mix and tamped into position under distraction.  I was very pleased with the press fit of the implant.  The Caspar pins were then removed and bone wax was placed in their place.  I then chose a 12-mm anterior cervical plate, which was placed over the anterior cervical spine.  Two vertebral body screws were placed in each vertebral body at C5 and C6 for a total of 6 vertebral body screws.  Again, locking mechanism was utilized.  I then copiously irrigated the wound.  I then obtained an additional lateral fluoroscopic view and was very pleased with the appearance of the implant and the anterior hardware on both AP and lateral radiographs.  At this point, the platysma was closed using 2-0 Vicryl. The skin was then closed using 3-0 Monocryl.  Benzoin and Steri-Strips were applied followed by sterile dressing.  All instrument counts were correct at the termination of the procedure.  Caitlyn Lane was my assistant for surgery and did aid in retraction, suctioning, and closure.     Caitlyn BambergMark Dahir Ayer, MD     Caitlyn Lane/MEDQ  D:  08/24/2013  T:  08/25/2013  Job:  [id number]  cc:   Dr. Deatra JamesVyvyan Lane

## 2013-08-25 NOTE — Progress Notes (Signed)
Patient doing well. Reports resolution of radicular right arm pain.  BP 136/78  Pulse 100  Temp(Src) 98.6 F (37 C) (Oral)  Resp 16  SpO2 95%  LMP 08/13/2013  NVI Dressing in place Soft collar well applied  S/p 5/6 acdf, doing well.  - d/c home today - f/u 2 weeks

## 2013-08-25 NOTE — Progress Notes (Signed)
Pt doing well. Pt and husband given D/C instructions with Rx's, verbal understanding of teaching was given. Pt D/C'd home via walking per MD order. Pt is stable @ D/C and has no other needs. Rema FendtAshley Twania Bujak, RN

## 2013-08-31 NOTE — Discharge Summary (Signed)
Patient ID: Caitlyn Lane MRN: [id number] DOB/AGE: 46/10/1965 47 y.o.  Admit date: 08/24/2013 Discharge date: 08/25/2013  Admission Diagnoses:  Active Problems:   Myelopathy   Discharge Diagnoses:  Same  Past Medical History  Diagnosis Date  . Muscle spasm     takes Flexeril daily as needed  . Insomnia     takes Valerian nighly as needed  . Pneumonia     waling-7-5238yrs ago  . Headache(784.0)   . Weakness     pain and tingling  . Constipation     related to pain meds  . Weak urinary stream     since taking pain med  . Urinary frequency   . Diabetes mellitus without complication     borderline  . Depression     takes Prozac daily as needed  . Herpes     no outbreak in yrs    Surgeries: Procedure(s): ANTERIOR CERVICAL DECOMPRESSION/DISCECTOMY FUSION 1 LEVEL C5-6 on 08/24/2013   Discharged Condition: Improved  Hospital Course: Caitlyn Lane is an 48 y.o. female who was admitted 08/24/2013 for operative treatment of myelopathy. Patient has severe unremitting pain that affects sleep, daily activities, and work/hobbies. After pre-op clearance the patient was taken to the operating room on 08/24/2013 and underwent  Procedure(s): ANTERIOR CERVICAL DECOMPRESSION/DISCECTOMY FUSION 1 LEVEL C5-6.    Patient was given perioperative antibiotics:  Anti-infectives   Start     Dose/Rate Route Frequency Ordered Stop   08/24/13 1600  ceFAZolin (ANCEF) IVPB 1 g/50 mL premix     1 g 100 mL/hr over 30 Minutes Intravenous Every 8 hours 08/24/13 1210 08/24/13 2339   08/24/13 0600  ceFAZolin (ANCEF) IVPB 2 g/50 mL premix     2 g 100 mL/hr over 30 Minutes Intravenous On call to O.R. 08/23/13 1418 08/24/13 [id number]       Patient was given sequential compression devices, early ambulation to prevent DVT.  Patient benefited maximally from hospital stay and there were no complications.    Recent vital signs: BP 120/90  Pulse 99  Temp(Src) 98.9 F (37.2 C) (Oral)  Resp 16  SpO2 95%  LMP  08/13/2013  Discharge Medications:     Medication List    STOP taking these medications       cyclobenzaprine 5 MG tablet  Commonly known as:  FLEXERIL     HYDROcodone-acetaminophen 10-325 MG per tablet  Commonly known as:  NORCO      TAKE these medications       FLUoxetine 20 MG capsule  Commonly known as:  PROZAC  Take 20 mg by mouth See admin instructions. Takes 20 mg daily x 2 weeks prior to her menstrual cycle.     VALERIAN PO  Take 2 capsules by mouth at bedtime. Used for sleep     varenicline 1 MG tablet  Commonly known as:  CHANTIX  Take 1 mg by mouth 2 (two) times daily.        Diagnostic Studies: Dg Chest 2 View  08/22/2013   CLINICAL DATA:  Pre op  EXAM: CHEST  2 VIEW  COMPARISON:  None.  FINDINGS: The heart size and mediastinal contours are within normal limits. Both lungs are clear. The visualized skeletal structures are unremarkable.  IMPRESSION: No active cardiopulmonary disease.   Electronically Signed   By: Salome HolmesHector  Cooper M.D.   On: 08/22/2013 15:59   Dg Cervical Spine 1 View  08/24/2013   CLINICAL DATA:  Neck pain.  EXAM: DG CERVICAL SPINE - 1 VIEW  COMPARISON:  MR CERVICAL SPINE W/O CM dated 08/08/2013  FINDINGS: C-arm film demonstrates C5-6 ACDF.  IMPRESSION: Satisfactory position and alignment.   Electronically Signed   By: Davonna Belling M.D.   On: 08/24/2013 16:14    Disposition: 01-Home or Self Care      Discharge Orders   Future Orders Complete By Expires   Discharge patient  As directed        S/p C5/6 acdf, doing well.  - d/c home today  - f/u 2 weeks  Signed: Georga Bora 08/31/2013, 1:49 PM

## 2015-09-07 IMAGING — CR DG CHEST 2V
2 series · 2 of 2 positions shown · non-contrast
Comparison: None.

CLINICAL DATA: Pre op

EXAM:
CHEST  2 VIEW

[w chest pa]
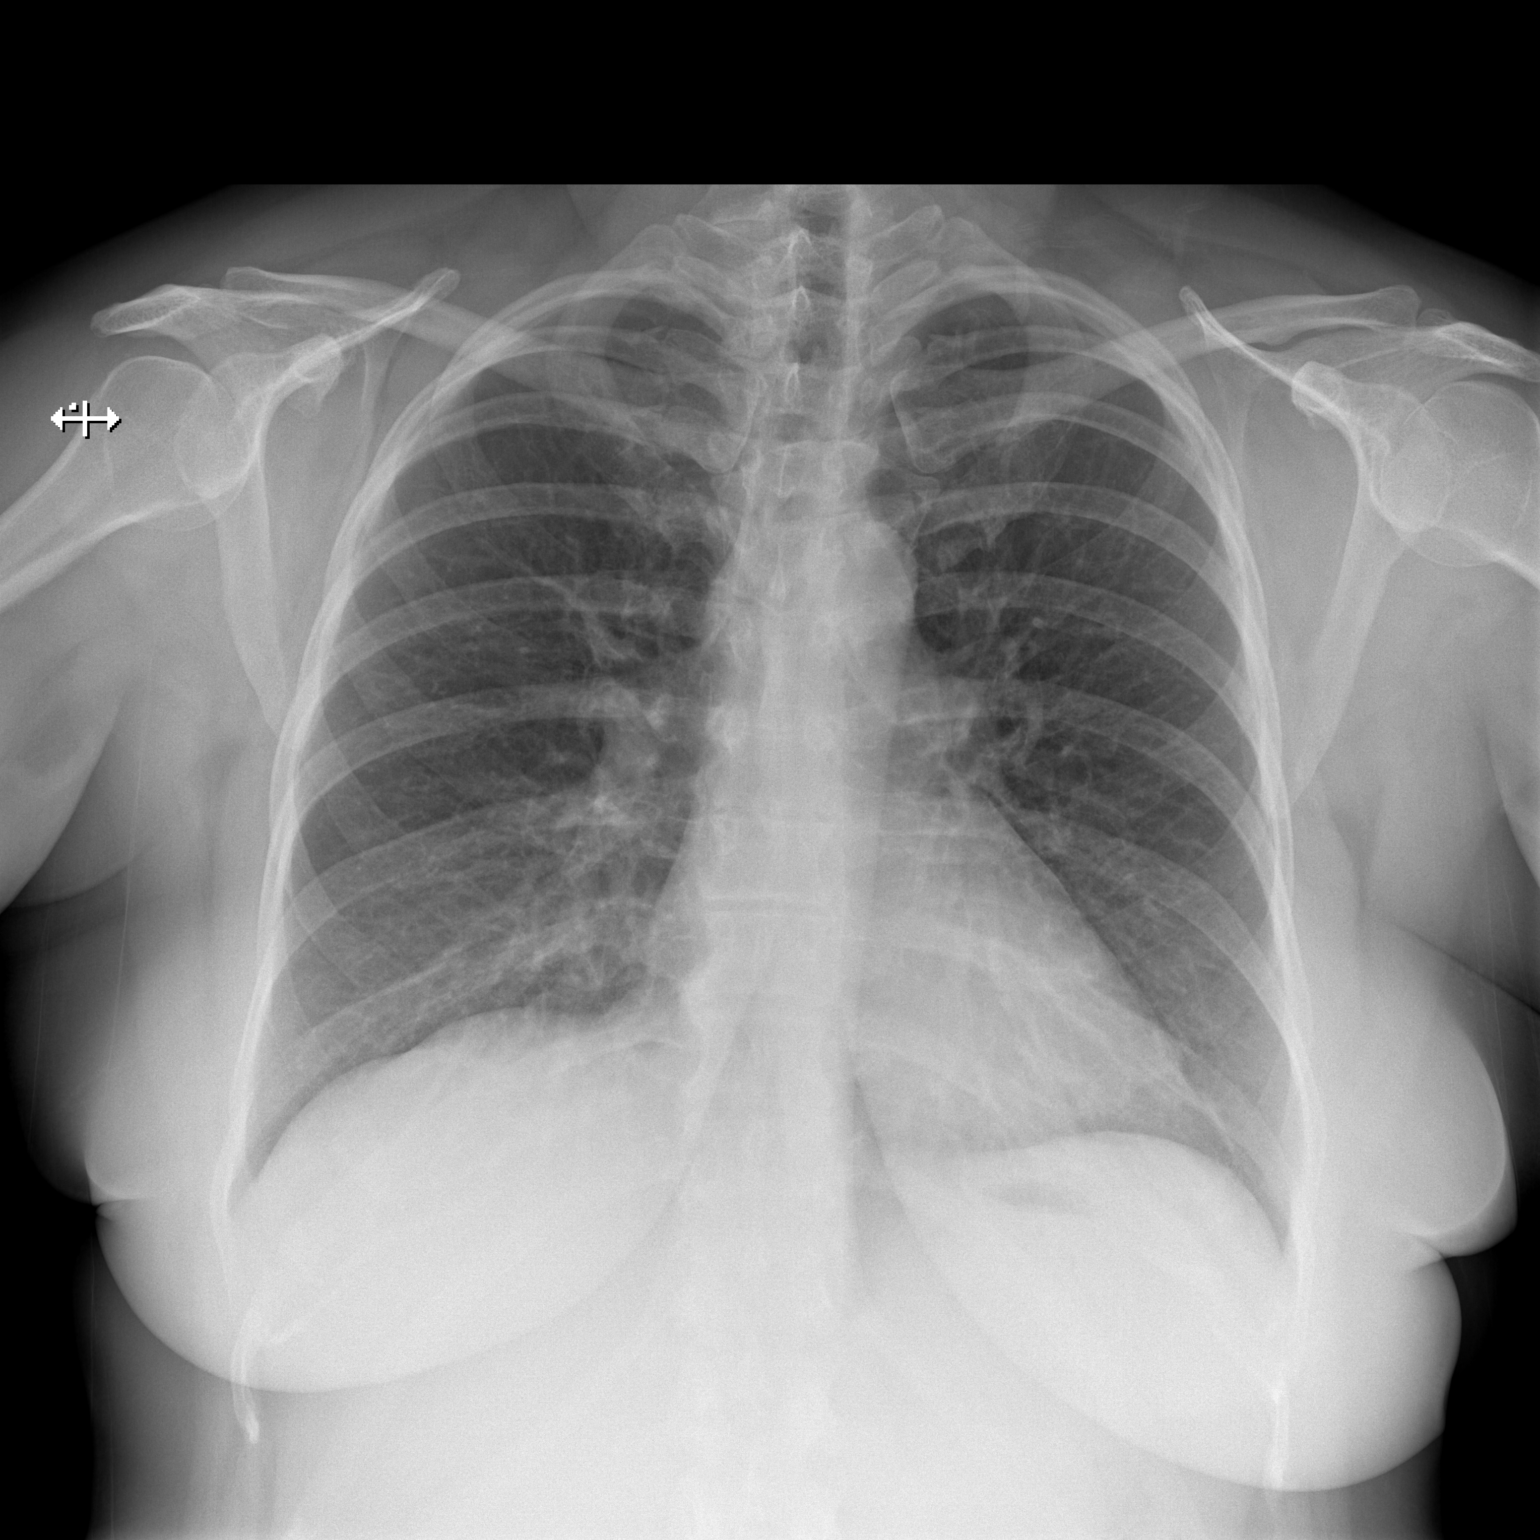

[w chest lat]
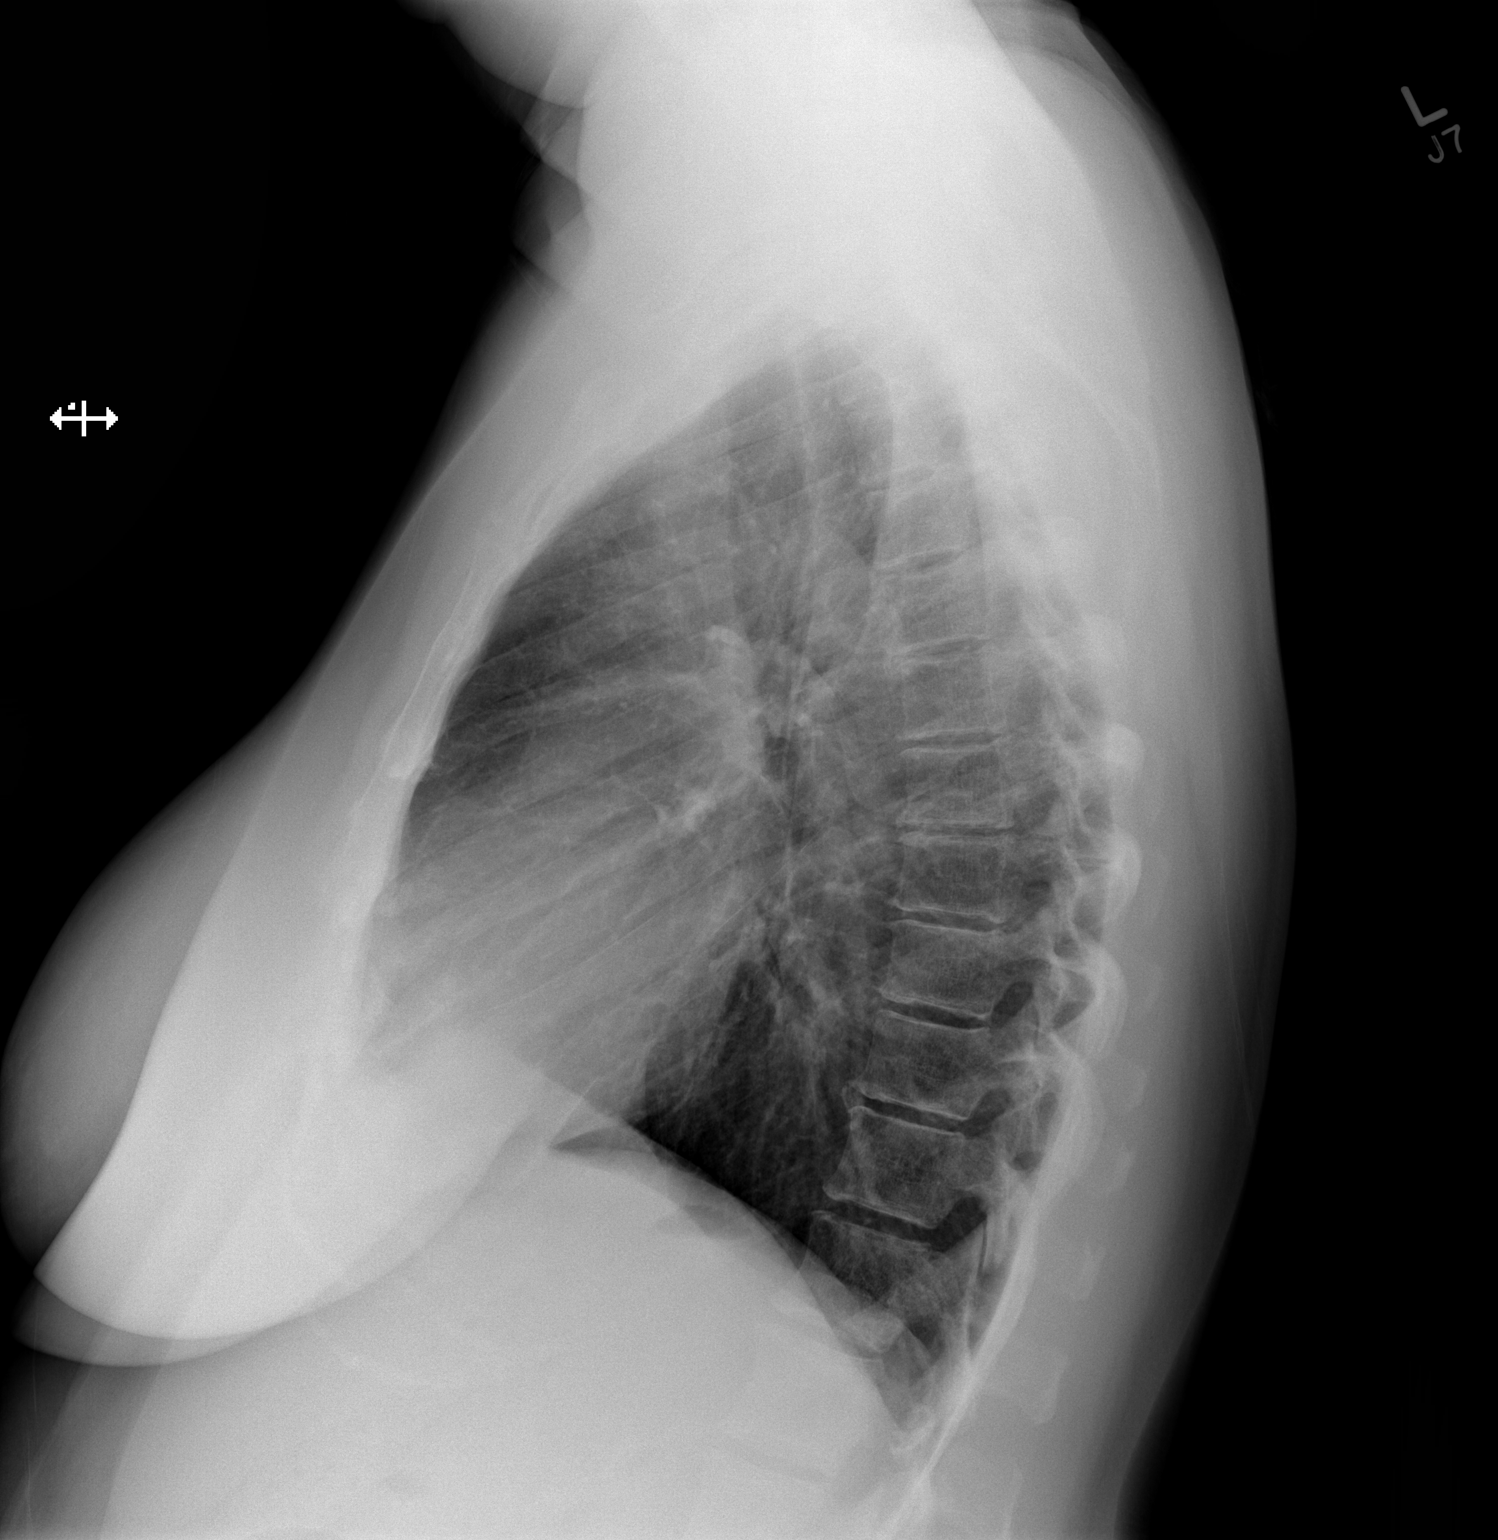

[2 of 2 positions shown; findings below may reference images not displayed]

FINDINGS: The heart size and mediastinal contours are within normal limits.
Both lungs are clear. The visualized skeletal structures are
unremarkable.
IMPRESSION: No active cardiopulmonary disease.
